# Patient Record
Sex: Female | Born: 1955 | Race: Black or African American | Hispanic: No | Marital: Married | Smoking: Never smoker
Health system: Southern US, Community
[De-identification: ages and names within clinical notes are randomized; demographics above are authoritative.]

## PROBLEM LIST (undated history)

## (undated) DIAGNOSIS — Z860100 Personal history of colon polyps, unspecified: Secondary | ICD-10-CM

## (undated) DIAGNOSIS — E785 Hyperlipidemia, unspecified: Secondary | ICD-10-CM

## (undated) DIAGNOSIS — K8020 Calculus of gallbladder without cholecystitis without obstruction: Secondary | ICD-10-CM

## (undated) HISTORY — PX: LITHOTRIPSY: SUR834

## (undated) HISTORY — PX: POLYPECTOMY: SHX149

## (undated) HISTORY — DX: Hyperlipidemia, unspecified: E78.5

## (undated) HISTORY — DX: Personal history of colonic polyps: Z86.010

## (undated) HISTORY — PX: PARTIAL HYSTERECTOMY: SHX80

## (undated) HISTORY — PX: ELBOW SURGERY: SHX618

## (undated) HISTORY — DX: Personal history of colon polyps, unspecified: Z86.0100

## (undated) HISTORY — DX: Calculus of gallbladder without cholecystitis without obstruction: K80.20

---

## 1996-06-24 HISTORY — PX: LAPAROSCOPIC CHOLECYSTECTOMY: SUR755

## 2012-02-07 ENCOUNTER — Other Ambulatory Visit: Payer: Self-pay | Admitting: Family Medicine

## 2012-02-07 DIAGNOSIS — Z780 Asymptomatic menopausal state: Secondary | ICD-10-CM

## 2012-02-07 DIAGNOSIS — Z90711 Acquired absence of uterus with remaining cervical stump: Secondary | ICD-10-CM

## 2012-02-07 DIAGNOSIS — Z1231 Encounter for screening mammogram for malignant neoplasm of breast: Secondary | ICD-10-CM

## 2012-03-09 ENCOUNTER — Ambulatory Visit
Admission: RE | Admit: 2012-03-09 | Discharge: 2012-03-09 | Disposition: A | Discharge status: Home / Self Care | Payer: BC Managed Care – PPO | Source: Ambulatory Visit | Attending: Family Medicine | Admitting: Family Medicine

## 2012-03-09 DIAGNOSIS — Z1231 Encounter for screening mammogram for malignant neoplasm of breast: Secondary | ICD-10-CM

## 2012-03-09 DIAGNOSIS — Z780 Asymptomatic menopausal state: Secondary | ICD-10-CM

## 2012-03-09 DIAGNOSIS — Z90711 Acquired absence of uterus with remaining cervical stump: Secondary | ICD-10-CM

## 2013-02-16 ENCOUNTER — Other Ambulatory Visit: Payer: Self-pay

## 2013-02-16 DIAGNOSIS — Z1231 Encounter for screening mammogram for malignant neoplasm of breast: Secondary | ICD-10-CM

## 2013-03-12 ENCOUNTER — Ambulatory Visit: Payer: BC Managed Care – PPO

## 2013-03-17 ENCOUNTER — Ambulatory Visit
Admission: RE | Admit: 2013-03-17 | Discharge: 2013-03-17 | Disposition: A | Discharge status: Home / Self Care | Payer: BC Managed Care – PPO | Source: Ambulatory Visit

## 2013-03-17 DIAGNOSIS — Z1231 Encounter for screening mammogram for malignant neoplasm of breast: Secondary | ICD-10-CM

## 2013-11-03 ENCOUNTER — Other Ambulatory Visit: Payer: Self-pay

## 2013-11-03 DIAGNOSIS — Z1231 Encounter for screening mammogram for malignant neoplasm of breast: Secondary | ICD-10-CM

## 2014-03-21 ENCOUNTER — Ambulatory Visit
Admission: RE | Admit: 2014-03-21 | Discharge: 2014-03-21 | Disposition: A | Discharge status: Home / Self Care | Payer: BC Managed Care – PPO | Source: Ambulatory Visit

## 2014-03-21 DIAGNOSIS — Z1231 Encounter for screening mammogram for malignant neoplasm of breast: Secondary | ICD-10-CM

## 2014-09-12 HISTORY — PX: COLONOSCOPY: SHX174

## 2015-02-13 ENCOUNTER — Other Ambulatory Visit: Payer: Self-pay

## 2015-02-13 DIAGNOSIS — Z1231 Encounter for screening mammogram for malignant neoplasm of breast: Secondary | ICD-10-CM

## 2015-03-23 ENCOUNTER — Ambulatory Visit
Admission: RE | Admit: 2015-03-23 | Discharge: 2015-03-23 | Disposition: A | Discharge status: Home / Self Care | Payer: BLUE CROSS/BLUE SHIELD | Source: Ambulatory Visit

## 2015-03-23 DIAGNOSIS — Z1231 Encounter for screening mammogram for malignant neoplasm of breast: Secondary | ICD-10-CM

## 2016-02-13 ENCOUNTER — Other Ambulatory Visit: Payer: Self-pay | Admitting: Family Medicine

## 2016-02-13 DIAGNOSIS — Z1231 Encounter for screening mammogram for malignant neoplasm of breast: Secondary | ICD-10-CM

## 2016-04-12 ENCOUNTER — Ambulatory Visit: Payer: BLUE CROSS/BLUE SHIELD

## 2016-04-29 ENCOUNTER — Ambulatory Visit
Admission: RE | Admit: 2016-04-29 | Discharge: 2016-04-29 | Disposition: A | Discharge status: Home / Self Care | Payer: BLUE CROSS/BLUE SHIELD | Source: Ambulatory Visit | Attending: Family Medicine | Admitting: Family Medicine

## 2016-04-29 DIAGNOSIS — Z1231 Encounter for screening mammogram for malignant neoplasm of breast: Secondary | ICD-10-CM

## 2016-05-15 ENCOUNTER — Other Ambulatory Visit: Payer: Self-pay | Admitting: Physician Assistant

## 2016-05-15 DIAGNOSIS — Z780 Asymptomatic menopausal state: Secondary | ICD-10-CM

## 2016-06-24 HISTORY — PX: BREAST BIOPSY: SHX20

## 2016-06-28 ENCOUNTER — Ambulatory Visit
Admission: RE | Admit: 2016-06-28 | Discharge: 2016-06-28 | Disposition: A | Discharge status: Home / Self Care | Payer: BLUE CROSS/BLUE SHIELD | Source: Ambulatory Visit | Attending: Physician Assistant | Admitting: Physician Assistant

## 2016-06-28 DIAGNOSIS — Z780 Asymptomatic menopausal state: Secondary | ICD-10-CM

## 2017-03-21 ENCOUNTER — Other Ambulatory Visit: Payer: Self-pay | Admitting: Family Medicine

## 2017-03-21 DIAGNOSIS — Z1231 Encounter for screening mammogram for malignant neoplasm of breast: Secondary | ICD-10-CM

## 2017-05-02 ENCOUNTER — Ambulatory Visit
Admission: RE | Admit: 2017-05-02 | Discharge: 2017-05-02 | Disposition: A | Discharge status: Home / Self Care | Payer: BLUE CROSS/BLUE SHIELD | Source: Ambulatory Visit | Attending: Family Medicine | Admitting: Family Medicine

## 2017-05-02 DIAGNOSIS — Z1231 Encounter for screening mammogram for malignant neoplasm of breast: Secondary | ICD-10-CM

## 2017-05-05 ENCOUNTER — Other Ambulatory Visit: Payer: Self-pay | Admitting: Family Medicine

## 2017-05-05 DIAGNOSIS — R928 Other abnormal and inconclusive findings on diagnostic imaging of breast: Secondary | ICD-10-CM

## 2017-05-08 ENCOUNTER — Other Ambulatory Visit: Payer: Self-pay | Admitting: Family Medicine

## 2017-05-08 ENCOUNTER — Ambulatory Visit
Admission: RE | Admit: 2017-05-08 | Discharge: 2017-05-08 | Disposition: A | Discharge status: Home / Self Care | Payer: BLUE CROSS/BLUE SHIELD | Source: Ambulatory Visit | Attending: Family Medicine | Admitting: Family Medicine

## 2017-05-08 DIAGNOSIS — R928 Other abnormal and inconclusive findings on diagnostic imaging of breast: Secondary | ICD-10-CM

## 2017-05-12 ENCOUNTER — Ambulatory Visit
Admission: RE | Admit: 2017-05-12 | Discharge: 2017-05-12 | Disposition: A | Discharge status: Home / Self Care | Payer: BLUE CROSS/BLUE SHIELD | Source: Ambulatory Visit | Attending: Family Medicine | Admitting: Family Medicine

## 2017-05-12 ENCOUNTER — Other Ambulatory Visit: Payer: Self-pay | Admitting: Family Medicine

## 2017-05-12 DIAGNOSIS — R928 Other abnormal and inconclusive findings on diagnostic imaging of breast: Secondary | ICD-10-CM

## 2017-10-01 ENCOUNTER — Other Ambulatory Visit: Payer: Self-pay | Admitting: Family Medicine

## 2017-10-01 DIAGNOSIS — R921 Mammographic calcification found on diagnostic imaging of breast: Secondary | ICD-10-CM

## 2017-11-11 ENCOUNTER — Ambulatory Visit
Admission: RE | Admit: 2017-11-11 | Discharge: 2017-11-11 | Disposition: A | Discharge status: Home / Self Care | Payer: 59 | Source: Ambulatory Visit | Attending: Family Medicine | Admitting: Family Medicine

## 2017-11-11 DIAGNOSIS — R921 Mammographic calcification found on diagnostic imaging of breast: Secondary | ICD-10-CM

## 2018-04-29 ENCOUNTER — Other Ambulatory Visit: Payer: Self-pay | Admitting: Family Medicine

## 2018-04-29 DIAGNOSIS — Z1231 Encounter for screening mammogram for malignant neoplasm of breast: Secondary | ICD-10-CM

## 2018-06-10 ENCOUNTER — Ambulatory Visit
Admission: RE | Admit: 2018-06-10 | Discharge: 2018-06-10 | Disposition: A | Discharge status: Home / Self Care | Payer: 59 | Source: Ambulatory Visit | Attending: Family Medicine | Admitting: Family Medicine

## 2018-06-10 DIAGNOSIS — Z1231 Encounter for screening mammogram for malignant neoplasm of breast: Secondary | ICD-10-CM

## 2019-04-30 ENCOUNTER — Other Ambulatory Visit: Payer: Self-pay | Admitting: Family Medicine

## 2019-04-30 DIAGNOSIS — Z1231 Encounter for screening mammogram for malignant neoplasm of breast: Secondary | ICD-10-CM

## 2019-06-23 ENCOUNTER — Other Ambulatory Visit: Payer: Self-pay

## 2019-06-23 ENCOUNTER — Other Ambulatory Visit: Payer: Self-pay | Admitting: Physician Assistant

## 2019-06-23 ENCOUNTER — Ambulatory Visit
Admission: RE | Admit: 2019-06-23 | Discharge: 2019-06-23 | Disposition: A | Discharge status: Home / Self Care | Payer: 59 | Source: Ambulatory Visit | Attending: Family Medicine | Admitting: Family Medicine

## 2019-06-23 DIAGNOSIS — Z1231 Encounter for screening mammogram for malignant neoplasm of breast: Secondary | ICD-10-CM

## 2019-11-12 ENCOUNTER — Telehealth: Payer: Self-pay | Admitting: Gastroenterology

## 2019-11-12 NOTE — Telephone Encounter (Signed)
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  Please advise recall.

## 2019-11-17 NOTE — Telephone Encounter (Signed)
Previous colonoscopy reviewed Colonoscopy 08/2014 ([facility]) -small internal hemorrhoids.  Otherwise normal to TI. However, due to history of advanced colonic polyps s/p EMR December 2014-was recommended to repeat in 5 years  Plan: -Repeat colonoscopy due 08/2019.  Please set her up for colonoscopy.  RG

## 2019-11-18 NOTE — Telephone Encounter (Signed)
Rhonda Sellers can you please schedule patient. Patient hasn't been in office so I think she would need an office visit first to talk about her procedure

## 2019-11-19 ENCOUNTER — Encounter: Payer: Self-pay | Admitting: Gastroenterology

## 2019-11-19 NOTE — Telephone Encounter (Signed)
Spoke w/pt. Will call back to schedule OV to discuss colon

## 2020-01-27 ENCOUNTER — Ambulatory Visit (INDEPENDENT_AMBULATORY_CARE_PROVIDER_SITE_OTHER): Payer: 59 | Admitting: Gastroenterology

## 2020-01-27 ENCOUNTER — Encounter: Payer: Self-pay | Admitting: Gastroenterology

## 2020-01-27 ENCOUNTER — Other Ambulatory Visit: Payer: Self-pay

## 2020-01-27 VITALS — BP 122/80 | HR 85 | Ht 67.0 in | Wt 182.4 lb

## 2020-01-27 DIAGNOSIS — Z86010 Personal history of colonic polyps: Secondary | ICD-10-CM

## 2020-01-27 NOTE — Progress Notes (Signed)
Chief Complaint: for colon  Referring Provider:  Marily Lente, MD      ASSESSMENT AND PLAN;   #1.  H/O colonic polyps  Plan: - Proceed with colonoscopy (any Monday after Aug 22). Discussed risks & benefits. (Risks including rare perforation req laparotomy, bleeding after bx/polypectomy req blood transfusion, rarely missing neoplasms, risks of anesthesia/sedation). Benefits outweigh the risks. Patient agrees to proceed. All the questions were answered. Consent forms given for review.    HPI:    Rhonda Sellers is a 64 y.o. female   Here for colonoscopy.  No nausea, vomiting, heartburn, regurgitation, odynophagia or dysphagia.  No significant diarrhea or constipation. No melena or hematochezia. No unintentional weight loss. No abdominal pain.   H/O advanced colonic polyps (TV adenoma) s/p piecemeal polypectomy December 2014.  Repeat colonoscopy 08/2014 ([facility]) did confirm complete removal. Advised to get colonoscopy in 5 years.  [employer], married Past Medical History:  Diagnosis Date  . Gallstones   . History of colon polyps     Past Surgical History:  Procedure Laterality Date  . BREAST BIOPSY Left 2018   benign  . COLONOSCOPY  09/12/2014   Small internal hemorrhoids. Otherwise normal colonoscopy to terminal ileum  . ELBOW SURGERY    . LAPAROSCOPIC CHOLECYSTECTOMY  1998  . LITHOTRIPSY    . PARTIAL HYSTERECTOMY      Family History  Problem Relation Age of Onset  . Breast cancer Mother   . Diabetes Father   . Breast cancer Maternal Grandmother   . Colon cancer Neg Hx   . Esophageal cancer Neg Hx     Social History   Tobacco Use  . Smoking status: Never Smoker  . Smokeless tobacco: Never Used  Vaping Use  . Vaping Use: Never used  Substance Use Topics  . Alcohol use: Not Currently  . Drug use: Never    Current Outpatient Medications  Medication Sig Dispense Refill  . atorvastatin (LIPITOR) 10 MG tablet Take 10 mg by mouth  daily.    . Cholecalciferol (VITAMIN D) 50 MCG (2000 UT) CAPS Take 1 capsule by mouth daily.    Marland Kitchen zolpidem (AMBIEN) 10 MG tablet Take 10 mg by mouth daily as needed.     No current facility-administered medications for this visit.    Allergies  Allergen Reactions  . Codeine Itching    Review of Systems:  Constitutional: Denies fever, chills, diaphoresis, appetite change and fatigue.  HEENT: Denies photophobia, eye pain, redness, hearing loss, ear pain, congestion, sore throat, rhinorrhea, sneezing, mouth sores, neck pain, neck stiffness and tinnitus.   Respiratory: Denies SOB, DOE, cough, chest tightness,  and wheezing.   Cardiovascular: Denies chest pain, palpitations and leg swelling.  Genitourinary: Denies dysuria, urgency, frequency, hematuria, flank pain and difficulty urinating.  Musculoskeletal: Denies myalgias, back pain, joint swelling, arthralgias and gait problem.  Skin: No rash.  Neurological: Denies dizziness, seizures, syncope, weakness, light-headedness, numbness and has headaches.  Hematological: Denies adenopathy. Easy bruising, personal or family bleeding history  Psychiatric/Behavioral: No anxiety or depression     Physical Exam:    BP 122/80   Pulse 85   Ht 5\' 7"  (1.702 m)   Wt 182 lb 6 oz (82.7 kg)   BMI 28.56 kg/m  Wt Readings from Last 3 Encounters:  01/27/20 182 lb 6 oz (82.7 kg)   Constitutional:  Well-developed, in no acute distress. Psychiatric: Normal mood and affect. Behavior is normal. HEENT: Pupils normal.  Conjunctivae are normal. No  scleral icterus.  Cardiovascular: Normal rate, regular rhythm. No edema Pulmonary/chest: Effort normal and breath sounds normal. No wheezing, rales or rhonchi. Abdominal: Soft, nondistended. Nontender. Bowel sounds active throughout. There are no masses palpable. No hepatomegaly. Rectal:  defered Neurological: Alert and oriented to person place and time. Skin: Skin is warm and dry. No rashes noted.     Edman Circle, MD 01/27/2020, 2:56 PM  Cc: Marily Lente, MD

## 2020-01-27 NOTE — Patient Instructions (Signed)
If you are age 64 or older, your body mass index should be between 23-30. Your Body mass index is 28.56 kg/m. If this is out of the aforementioned range listed, please consider follow up with your Primary Care Provider.  If you are age 86 or younger, your body mass index should be between 19-25. Your Body mass index is 28.56 kg/m. If this is out of the aformentioned range listed, please consider follow up with your Primary Care Provider.   You have been scheduled for a colonoscopy. Please follow written instructions given to you at your visit today.  You have been given a Clenpiq prep today. If you use inhalers (even only as needed), please bring them with you on the day of your procedure.  Thank you,  Dr. Lynann Bologna

## 2020-02-08 ENCOUNTER — Encounter: Payer: Self-pay | Admitting: Gastroenterology

## 2020-02-21 ENCOUNTER — Encounter: Payer: Self-pay | Admitting: Gastroenterology

## 2020-02-21 ENCOUNTER — Other Ambulatory Visit: Payer: Self-pay

## 2020-02-21 ENCOUNTER — Ambulatory Visit (AMBULATORY_SURGERY_CENTER): Payer: 59 | Admitting: Gastroenterology

## 2020-02-21 VITALS — BP 148/84 | HR 65 | Temp 96.0°F | Resp 14 | Ht 67.0 in | Wt 182.0 lb

## 2020-02-21 DIAGNOSIS — Z86010 Personal history of colonic polyps: Secondary | ICD-10-CM

## 2020-02-21 MED ORDER — SODIUM CHLORIDE 0.9 % IV SOLN: 500.0000 mL | Freq: Once | INTRAVENOUS | Status: DC

## 2020-02-21 NOTE — Op Note (Signed)
[facility] Patient Name: Rhonda Sellers Procedure Date: 02/21/2020 2:27 PM MRN: [id number] Endoscopist: Lynann Bologna , MD Age: 64 Referring MD:  Date of Birth: 1955-08-31 Gender: Female Account #: [id number] Procedure:                Colonoscopy Indications:              High risk colon cancer surveillance: Personal                            history of advanced colonic polyps Medicines:                Monitored Anesthesia Care Procedure:                Pre-Anesthesia Assessment:                           - Prior to the procedure, a History and Physical                            was performed, and patient medications and                            allergies were reviewed. The patient's tolerance of                            previous anesthesia was also reviewed. The risks                            and benefits of the procedure and the sedation                            options and risks were discussed with the patient.                            All questions were answered, and informed consent                            was obtained. Prior Anticoagulants: The patient has                            taken no previous anticoagulant or antiplatelet                            agents. ASA Grade Assessment: II - A patient with                            mild systemic disease. After reviewing the risks                            and benefits, the patient was deemed in                            satisfactory condition to undergo the procedure.  After obtaining informed consent, the colonoscope                            was passed under direct vision. Throughout the                            procedure, the patient's blood pressure, pulse, and                            oxygen saturations were monitored continuously. The                            [facility] H190 Colonoscope was introduced through the                            anus and advanced to the 2 cm  into the ileum. The                            colonoscope was passed with some difficulty through                            the sigmoid colon thereafter with ease to cecum.                            The patient tolerated the procedure well. The                            quality of the bowel preparation was good. The                            terminal ileum, ileocecal valve, appendiceal                            orifice, and rectum were photographed. Scope In: 2:38:58 PM Scope Out: 2:51:31 PM Scope Withdrawal Time: 0 hours 8 minutes 10 seconds  Total Procedure Duration: 0 hours 12 minutes 33 seconds  Findings:                 A few small-mouthed diverticula were found in the                            sigmoid colon.                           Non-bleeding internal hemorrhoids were found during                            retroflexion. The hemorrhoids were small.                           The terminal ileum appeared normal.                           The exam was otherwise without abnormality on  direct and retroflexion views. Complications:            No immediate complications. Estimated Blood Loss:     Estimated blood loss: none. Impression:               -Mild sigmoid diverticulosis.                           -Otherwise normal colonoscopy. Recommendation:           - Patient has a contact number available for                            emergencies. The signs and symptoms of potential                            delayed complications were discussed with the                            patient. Return to normal activities tomorrow.                            Written discharge instructions were provided to the                            patient.                           - Resume previous diet.                           - Continue present medications.                           - Repeat colonoscopy in 5 years for screening                            purposes.  Earlier, if with any new problems or if                            there is any change in family history                           - Return to GI clinic PRN.                           - The findings and recommendations were discussed                            with the patient's husband Kriste Basque, MD 02/21/2020 2:56:30 PM This report has been signed electronically.

## 2020-02-21 NOTE — Patient Instructions (Addendum)
Handouts were given to you on diverticulosis  and hemorrhoids. You may resume your current medications today. Repeat colonoscopy in 5 years for screening purposes.  Earlier, if with any new problems or if there is any changes in your family history. Please call if any questions or concerns.    YOU HAD AN ENDOSCOPIC PROCEDURE TODAY AT THE [facility]:   Refer to the procedure report that was given to you for any specific questions about what was found during the examination.  If the procedure report does not answer your questions, please call your gastroenterologist to clarify.  If you requested that your care partner not be given the details of your procedure findings, then the procedure report has been included in a sealed envelope for you to review at your convenience later.  YOU SHOULD EXPECT: Some feelings of bloating in the abdomen. Passage of more gas than usual.  Walking can help get rid of the air that was put into your GI tract during the procedure and reduce the bloating. If you had a lower endoscopy (such as a colonoscopy or flexible sigmoidoscopy) you may notice spotting of blood in your stool or on the toilet paper. If you underwent a bowel prep for your procedure, you may not have a normal bowel movement for a few days.  Please Note:  You might notice some irritation and congestion in your nose or some drainage.  This is from the oxygen used during your procedure.  There is no need for concern and it should clear up in a day or so.  SYMPTOMS TO REPORT IMMEDIATELY:   Following lower endoscopy (colonoscopy or flexible sigmoidoscopy):  Excessive amounts of blood in the stool  Significant tenderness or worsening of abdominal pains  Swelling of the abdomen that is new, acute  Fever of 100F or higher    For urgent or emergent issues, a gastroenterologist can be reached at any hour by calling [phone]. Do not use MyChart messaging for urgent concerns.    DIET:   We do recommend a small meal at first, but then you may proceed to your regular diet.  Drink plenty of fluids but you should avoid alcoholic beverages for 24 hours.  ACTIVITY:  You should plan to take it easy for the rest of today and you should NOT DRIVE or use heavy machinery until tomorrow (because of the sedation medicines used during the test).    FOLLOW UP: Our staff will call the number listed on your records 48-72 hours following your procedure to check on you and address any questions or concerns that you may have regarding the information given to you following your procedure. If we do not reach you, we will leave a message.  We will attempt to reach you two times.  During this call, we will ask if you have developed any symptoms of COVID 19. If you develop any symptoms (ie: fever, flu-like symptoms, shortness of breath, cough etc.) before then, please call [phone].  If you test positive for Covid 19 in the 2 weeks post procedure, please call and report this information to [place].    If any biopsies were taken you will be contacted by phone or by letter within the next 1-3 weeks.  Please call us at [phone] if you have not heard about the biopsies in 3 weeks.    SIGNATURES/CONFIDENTIALITY: You and/or your care partner have signed paperwork which will be entered into your electronic medical record.  These signatures attest  to the fact that that the information above on your After Visit Summary has been reviewed and is understood.  Full responsibility of the confidentiality of this discharge information lies with you and/or your care-partner. 

## 2020-02-21 NOTE — Progress Notes (Signed)
Report to PACU, RN, vss, BBS= Clear.  

## 2020-02-21 NOTE — Progress Notes (Signed)
VS AG °

## 2020-02-21 NOTE — Progress Notes (Signed)
Dr. Chales Abrahams spoke with Pt and there care partner regarding the findings from Colonoscopy today.  No problems were noted in the recovery room. maw

## 2020-02-23 ENCOUNTER — Telehealth: Payer: Self-pay

## 2020-02-23 NOTE — Telephone Encounter (Signed)
°  Follow up Call-  Call back number 02/21/2020  Post procedure Call Back phone  # [phone]  Permission to leave phone message Yes  Some recent data might be hidden     Patient questions:  Do you have a fever, pain , or abdominal swelling? No. Pain Score  0 *  Have you tolerated food without any problems? Yes.    Have you been able to return to your normal activities? Yes.    Do you have any questions about your discharge instructions: Diet   No. Medications  No. Follow up visit  No.  Do you have questions or concerns about your Care? No.  Actions: * If pain score is 4 or above: No action needed, pain <4. 1. Have you developed a fever since your procedure? no  2.   Have you had an respiratory symptoms (SOB or cough) since your procedure? no  3.   Have you tested positive for COVID 19 since your procedure no  4.   Have you had any family members/close contacts diagnosed with the COVID 19 since your procedure?  no   If yes to any of these questions please route to Laverna Peace, RN and Karlton Lemon, RN

## 2020-05-04 ENCOUNTER — Other Ambulatory Visit: Payer: Self-pay | Admitting: Physician Assistant

## 2020-05-04 DIAGNOSIS — Z1231 Encounter for screening mammogram for malignant neoplasm of breast: Secondary | ICD-10-CM

## 2020-06-27 ENCOUNTER — Other Ambulatory Visit: Payer: Self-pay

## 2020-06-27 ENCOUNTER — Ambulatory Visit
Admission: RE | Admit: 2020-06-27 | Discharge: 2020-06-27 | Disposition: A | Discharge status: Home / Self Care | Payer: 59 | Source: Ambulatory Visit | Attending: Physician Assistant | Admitting: Physician Assistant

## 2020-06-27 DIAGNOSIS — Z1231 Encounter for screening mammogram for malignant neoplasm of breast: Secondary | ICD-10-CM

## 2021-07-26 ENCOUNTER — Other Ambulatory Visit: Payer: Self-pay | Admitting: Family

## 2021-07-26 ENCOUNTER — Other Ambulatory Visit: Payer: Self-pay | Admitting: Physician Assistant

## 2021-07-26 DIAGNOSIS — Z1231 Encounter for screening mammogram for malignant neoplasm of breast: Secondary | ICD-10-CM

## 2021-08-10 ENCOUNTER — Other Ambulatory Visit: Payer: Self-pay

## 2021-08-10 ENCOUNTER — Ambulatory Visit
Admission: RE | Admit: 2021-08-10 | Discharge: 2021-08-10 | Disposition: A | Discharge status: Home / Self Care | Payer: Medicare Other | Source: Ambulatory Visit | Attending: Family | Admitting: Family

## 2021-08-10 DIAGNOSIS — Z1231 Encounter for screening mammogram for malignant neoplasm of breast: Secondary | ICD-10-CM

## 2022-07-15 ENCOUNTER — Other Ambulatory Visit: Payer: Self-pay | Admitting: Family

## 2022-07-15 DIAGNOSIS — Z1231 Encounter for screening mammogram for malignant neoplasm of breast: Secondary | ICD-10-CM

## 2022-09-03 ENCOUNTER — Ambulatory Visit
Admission: RE | Admit: 2022-09-03 | Discharge: 2022-09-03 | Disposition: A | Discharge status: Home / Self Care | Payer: Medicare Other | Source: Ambulatory Visit | Attending: Family | Admitting: Family

## 2022-09-03 DIAGNOSIS — Z1231 Encounter for screening mammogram for malignant neoplasm of breast: Secondary | ICD-10-CM

## 2023-07-31 ENCOUNTER — Other Ambulatory Visit: Payer: Self-pay | Admitting: Family

## 2023-07-31 DIAGNOSIS — Z0000 Encounter for general adult medical examination without abnormal findings: Secondary | ICD-10-CM

## 2023-09-05 ENCOUNTER — Ambulatory Visit
Admission: RE | Admit: 2023-09-05 | Discharge: 2023-09-05 | Disposition: A | Discharge status: Home / Self Care | Payer: Medicare Other | Source: Ambulatory Visit | Attending: Family | Admitting: Family

## 2023-09-05 DIAGNOSIS — Z0000 Encounter for general adult medical examination without abnormal findings: Secondary | ICD-10-CM

## 2023-09-10 ENCOUNTER — Other Ambulatory Visit: Payer: Self-pay | Admitting: Family

## 2023-09-10 DIAGNOSIS — R928 Other abnormal and inconclusive findings on diagnostic imaging of breast: Secondary | ICD-10-CM

## 2023-09-20 ENCOUNTER — Encounter

## 2023-10-02 ENCOUNTER — Other Ambulatory Visit: Payer: Self-pay | Admitting: Family

## 2023-10-02 ENCOUNTER — Ambulatory Visit
Admission: RE | Admit: 2023-10-02 | Discharge: 2023-10-02 | Disposition: A | Discharge status: Home / Self Care | Source: Ambulatory Visit | Attending: Family | Admitting: Family

## 2023-10-02 DIAGNOSIS — R928 Other abnormal and inconclusive findings on diagnostic imaging of breast: Secondary | ICD-10-CM

## 2023-10-02 DIAGNOSIS — R921 Mammographic calcification found on diagnostic imaging of breast: Secondary | ICD-10-CM

## 2024-01-21 IMAGING — MG MM DIGITAL SCREENING BILAT W/ TOMO AND CAD
8 series · 8 of 24 positions shown · non-contrast
Comparison: Previous exam(s).

CLINICAL DATA: Screening.

EXAM:
DIGITAL SCREENING BILATERAL MAMMOGRAM WITH TOMOSYNTHESIS AND CAD
TECHNIQUE: Bilateral screening digital craniocaudal and mediolateral oblique
mammograms were obtained. Bilateral screening digital breast
tomosynthesis was performed. The images were evaluated with
computer-aided detection.

[L MLO synth-2D]
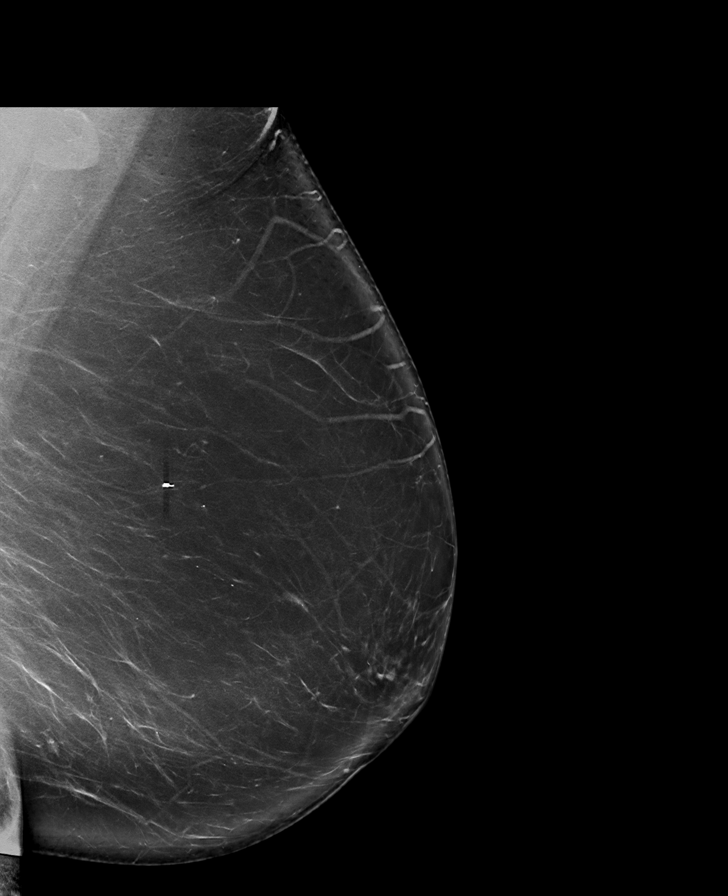

[R CC synth-2D]
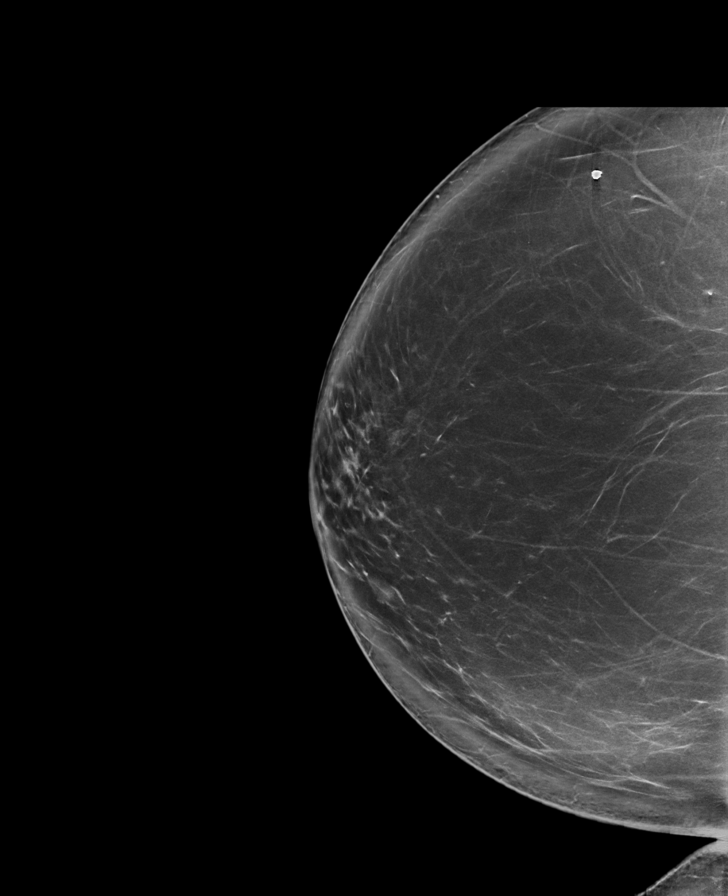

[R MLO synth-2D]
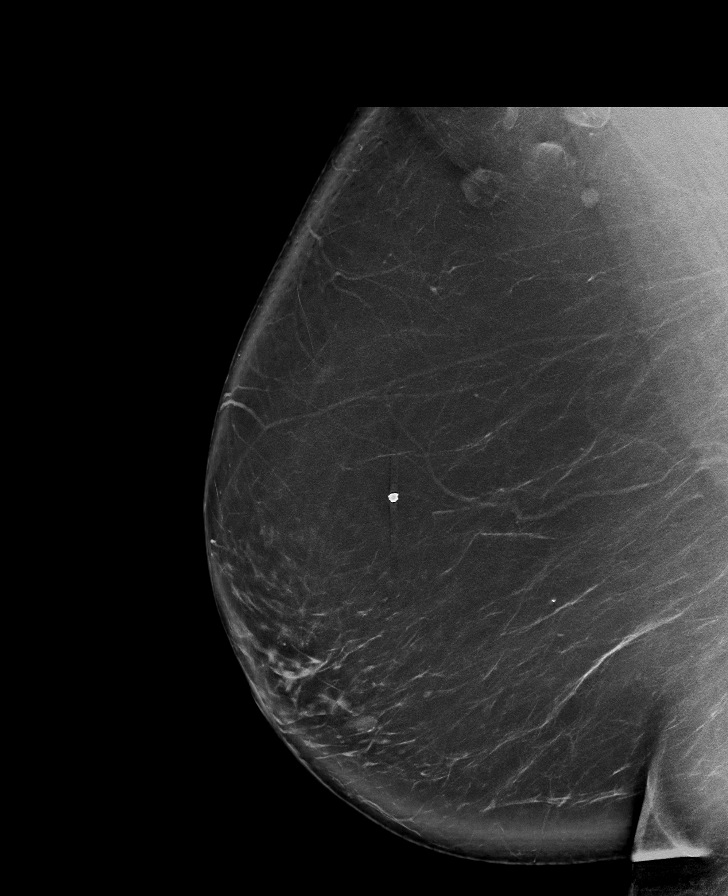

[L CC synth-2D]
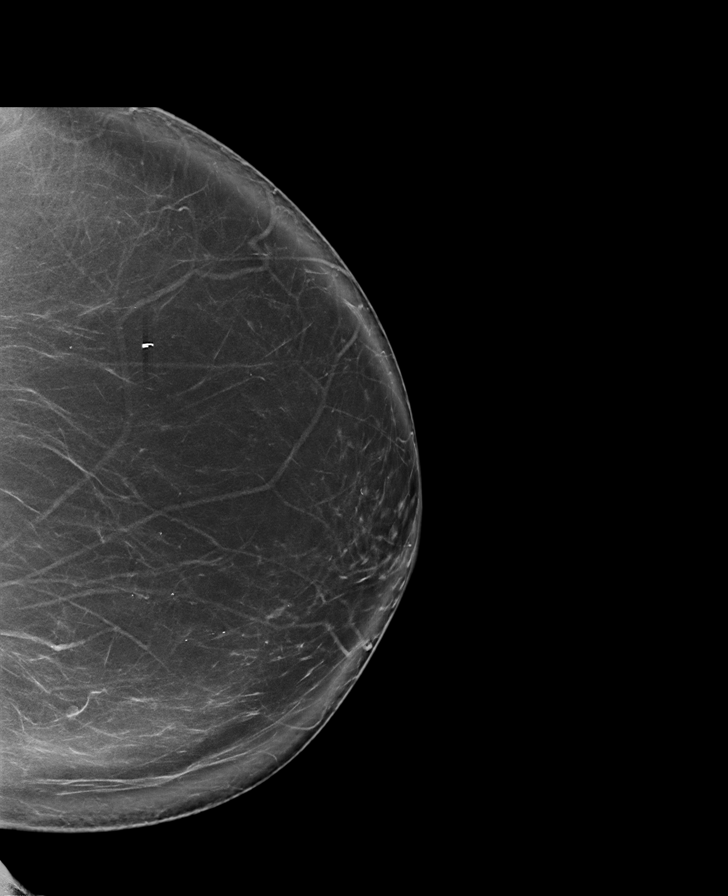

[R MLO tomo · tomo slice 59/116.0]
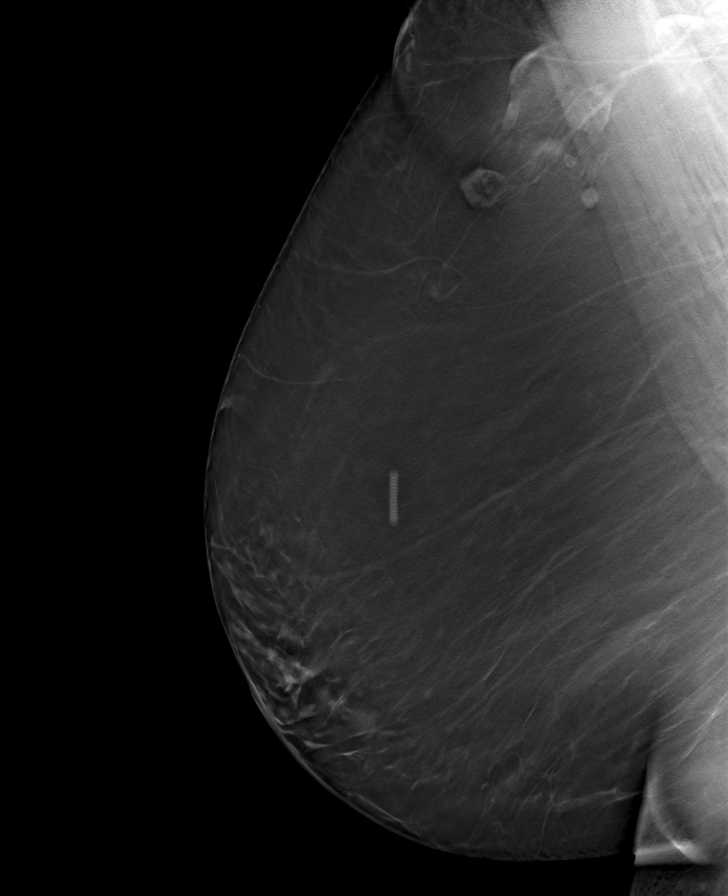

[L CC tomo · tomo slice 51/100.0]
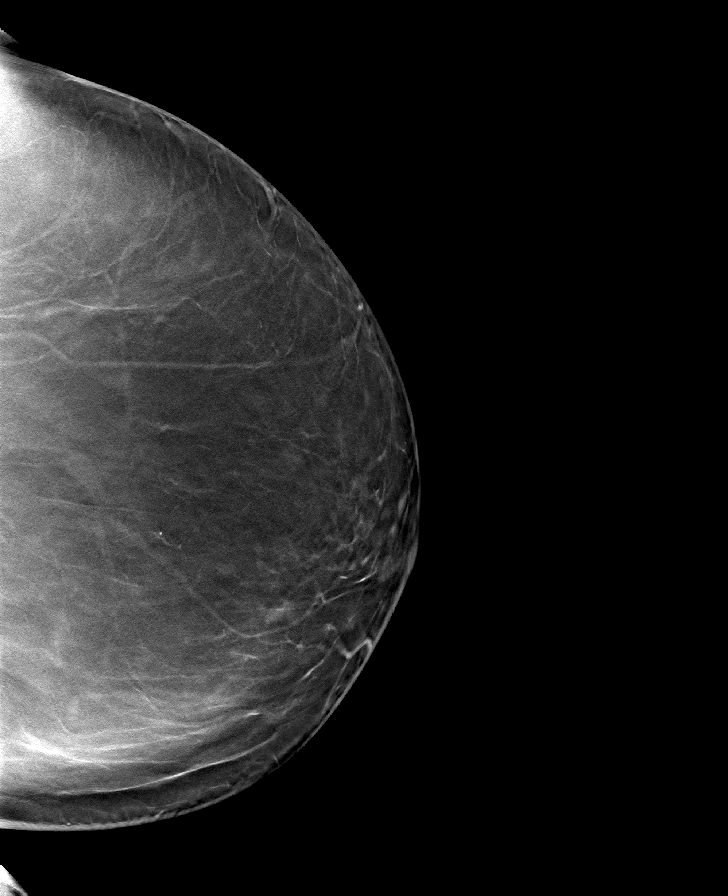

[L MLO tomo · tomo slice 53/105.0]
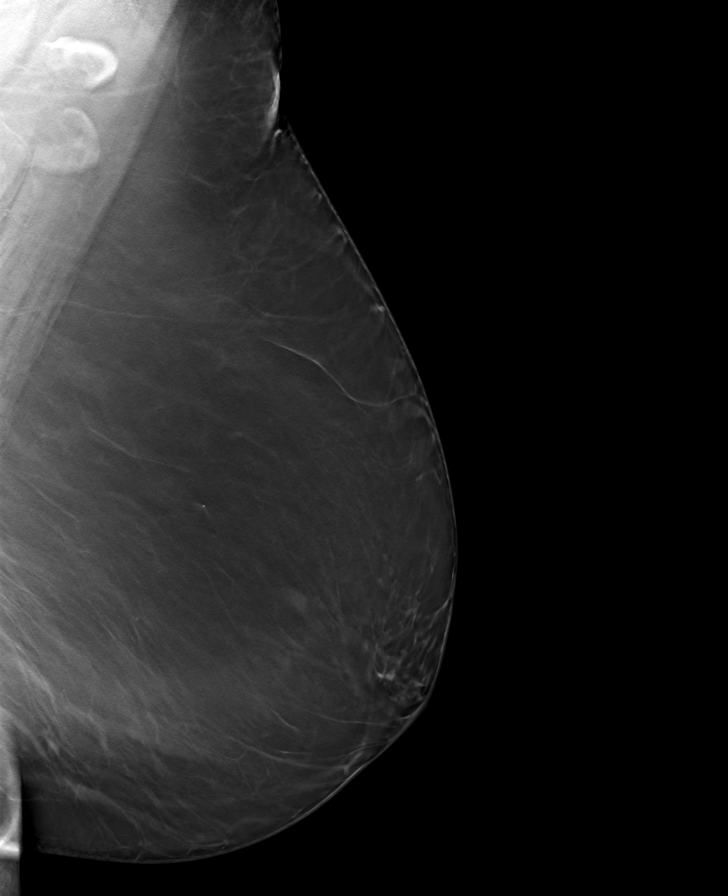

[R CC tomo · tomo slice 53/105.0]
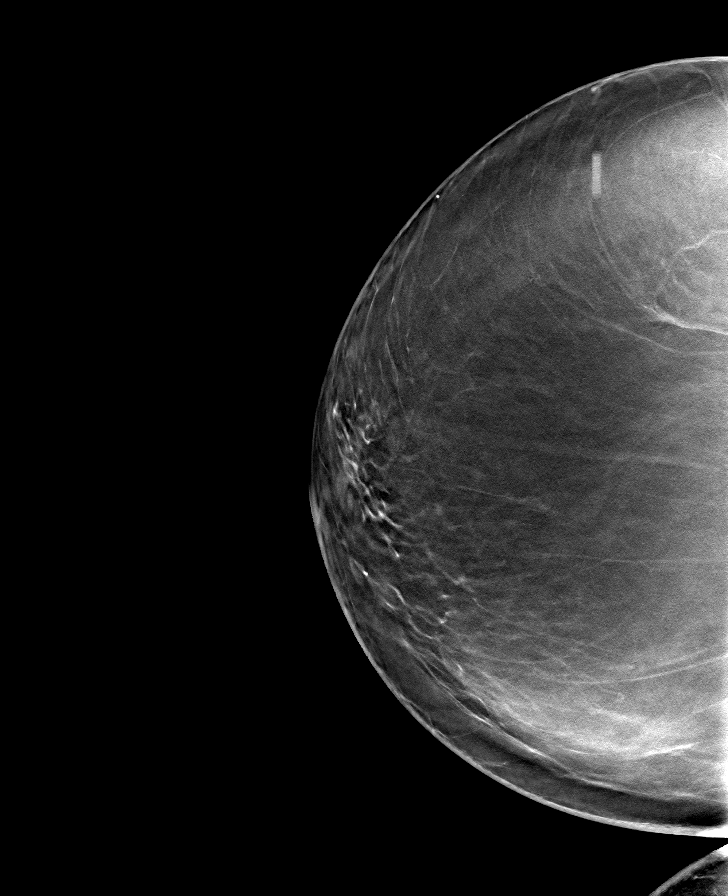

[8 of 24 positions shown; findings below may reference images not displayed]

ACR Breast Density Category b: There are scattered areas of
fibroglandular density.
FINDINGS: There are no findings suspicious for malignancy.
IMPRESSION: No mammographic evidence of malignancy. A result letter of this
screening mammogram will be mailed directly to the patient.

RECOMMENDATION:
Screening mammogram in one year. (Code:51-O-LD2)

BI-RADS CATEGORY  1: Negative.

## 2024-04-09 ENCOUNTER — Encounter

## 2024-04-15 ENCOUNTER — Other Ambulatory Visit: Payer: Self-pay | Admitting: Family Medicine

## 2024-04-15 ENCOUNTER — Ambulatory Visit
Admission: RE | Admit: 2024-04-15 | Discharge: 2024-04-15 | Disposition: A | Source: Ambulatory Visit | Attending: Family | Admitting: Family

## 2024-04-15 DIAGNOSIS — R928 Other abnormal and inconclusive findings on diagnostic imaging of breast: Secondary | ICD-10-CM

## 2024-04-15 DIAGNOSIS — R921 Mammographic calcification found on diagnostic imaging of breast: Secondary | ICD-10-CM

## 2024-09-06 ENCOUNTER — Encounter
